# Patient Record
Sex: Female | Born: 1975 | Race: Black or African American | Hispanic: No | Marital: Single | Smoking: Current every day smoker
Health system: Southern US, Community
[De-identification: ages and names within clinical notes are randomized; demographics above are authoritative.]

## PROBLEM LIST (undated history)

## (undated) DIAGNOSIS — F419 Anxiety disorder, unspecified: Secondary | ICD-10-CM

## (undated) DIAGNOSIS — D649 Anemia, unspecified: Secondary | ICD-10-CM

## (undated) HISTORY — DX: Anxiety disorder, unspecified: F41.9

## (undated) HISTORY — PX: TUBAL LIGATION: SHX77

## (undated) HISTORY — DX: Anemia, unspecified: D64.9

---

## 2014-07-03 ENCOUNTER — Emergency Department: Payer: Self-pay | Admitting: Emergency Medicine

## 2017-11-28 ENCOUNTER — Emergency Department
Admission: EM | Admit: 2017-11-28 | Discharge: 2017-11-28 | Disposition: A | Discharge status: Home / Self Care | Payer: Self-pay | Attending: Emergency Medicine | Admitting: Emergency Medicine

## 2017-11-28 ENCOUNTER — Encounter: Payer: Self-pay | Admitting: Emergency Medicine

## 2017-11-28 DIAGNOSIS — G5602 Carpal tunnel syndrome, left upper limb: Secondary | ICD-10-CM | POA: Insufficient documentation

## 2017-11-28 MED ORDER — MELOXICAM 15 MG PO TABS: 15.0000 mg | ORAL_TABLET | Freq: Every day | ORAL | 0 refills | Status: DC

## 2017-11-28 NOTE — Discharge Instructions (Signed)
Wear the brace for the next several days with the exception of showering.  Follow up with the orthopedic doctor if not improving over a week or so.  Return to the ER for symptoms that change or worsen if unable to schedule an appointment.

## 2017-11-28 NOTE — ED Provider Notes (Signed)
[facility] Emergency Department Provider Note ____________________________________________  Time seen: Approximately 2:08 PM  I have reviewed the triage vital signs and the nursing notes.   HISTORY  Chief Complaint Tingling    HPI Lori Bradley is a 42 y.o. female who presents to the emergency department for treatment and evaluation of left arm tingling that started while she was folding laundry today.  She states that it starts in the elbow and radiates down to the hand.  When this occurs, she states that it causes her to have an increase in her anxiety level.  She has had no other symptoms.  Tingling is intermittent and lasts approximately 2 minutes with each episode.  She has had no weakness in the hand.  No alleviating measures have been attempted complaint prior to arrival.  History reviewed. No pertinent past medical history.  There are no active problems to display for this patient.   History reviewed. No pertinent surgical history.  Prior to Admission medications   Medication Sig Start Date End Date Taking? Authorizing Provider  meloxicam (MOBIC) 15 MG tablet Take 1 tablet (15 mg total) by mouth daily. 11/28/17   Chinita Pester, FNP    Allergies Patient has no allergy information on record.  No family history on file.  Social History Social History   Tobacco Use  . Smoking status: Not on file  Substance Use Topics  . Alcohol use: Not on file  . Drug use: Not on file    Review of Systems Constitutional: Negative for fever or recent illness. Cardiovascular: Negative for chest pain Respiratory: Negative for shortness of breath Musculoskeletal: Positive for left arm pain Skin: Negative for rash, lesion, or wound  Neurological: Positive for paresthesias of the left hand.  ____________________________________________   PHYSICAL EXAM:  VITAL SIGNS: ED Triage Vitals [11/28/17 1223]  Enc Vitals Group     BP (!) 142/95     Pulse  Rate 79     Resp 20     Temp 98.7 F (37.1 C)     Temp Source Oral     SpO2 99 %     Weight 199 lb (90.3 kg)     Height 5\' 5"  (1.651 m)     Head Circumference      Peak Flow      Pain Score 6     Pain Loc      Pain Edu?      Excl. in GC?     Constitutional: Alert and oriented. Well appearing and in no acute distress. Eyes: Conjunctivae are clear without discharge or drainage Head: Atraumatic Neck: Supple Respiratory: Respirations even and unlabored.  Breath sounds clear to auscultation. Musculoskeletal: Patient has full, active range of motion of the left upper extremity.  Phalen's test is positive of the left hand Neurologic: Paresthesias of the left lower arm and hand with Phalen's test Skin: Intact, dry, no lesions or wounds. Psychiatric: Affect and behavior are appropriate  ____________________________________________   LABS (all labs ordered are listed, but only abnormal results are displayed)  Labs Reviewed - No data to display ____________________________________________  RADIOLOGY  Not indicated ____________________________________________   PROCEDURES  Procedures  ____________________________________________   INITIAL IMPRESSION / ASSESSMENT AND PLAN / ED COURSE  Lori Bradley is a 42 y.o. female who presents to the emergency department for treatment and evaluation of sudden onset left lower arm tingling that started today while folding laundry.  Symptoms and exam are most consistent with carpal tunnel.  A cockup splint was applied by the ER tech.  Patient was neurovascularly intact post application.  She will be given a prescription for meloxicam.  She was encouraged to follow-up with orthopedics for symptoms that are not improving over the week.  She was instructed to return to the emergency department for symptoms of change or worsen if she is unable to schedule an appointment.  Medications - No data to display  Pertinent labs & imaging results  that were available during my care of the patient were reviewed by me and considered in my medical decision making (see chart for details).  _________________________________________   FINAL CLINICAL IMPRESSION(S) / ED DIAGNOSES  Final diagnoses:  Carpal tunnel syndrome on left    ED Discharge Orders        Ordered    meloxicam (MOBIC) 15 MG tablet  Daily     11/28/17 1508       If controlled substance prescribed during this visit, 12 month history viewed on the [facility] prior to issuing an initial prescription for Schedule II or III opiod.    Chinita Pesterriplett, January Bergthold B, FNP 11/28/17 1547    Governor RooksLord, Rebecca, MD 11/29/17 [phone]

## 2017-11-28 NOTE — ED Triage Notes (Signed)
Pt reports left arm tingling and when it does it raises her anxiety. Pt denies injuries. Pt denies other symptoms. Pt reports tingling is intermittent and from the elbow down.

## 2017-11-28 NOTE — ED Notes (Signed)
See triage note  Presents with some intermittent tingling to left arm this am    States each episode last approx 2 mins  Grips equal

## 2017-11-28 NOTE — ED Notes (Signed)
FN:pt with left arm tingling from elbow down to hand started this am. Denies any other sx. Pt ambulatory to stat desk with no difficulty noted.

## 2018-03-05 ENCOUNTER — Other Ambulatory Visit: Payer: Self-pay

## 2018-03-05 ENCOUNTER — Encounter: Payer: Self-pay | Admitting: Emergency Medicine

## 2018-03-05 ENCOUNTER — Emergency Department
Admission: EM | Admit: 2018-03-05 | Discharge: 2018-03-05 | Disposition: A | Discharge status: Home / Self Care | Payer: Self-pay | Attending: Emergency Medicine | Admitting: Emergency Medicine

## 2018-03-05 ENCOUNTER — Emergency Department: Payer: Self-pay

## 2018-03-05 DIAGNOSIS — R55 Syncope and collapse: Secondary | ICD-10-CM | POA: Insufficient documentation

## 2018-03-05 DIAGNOSIS — F17210 Nicotine dependence, cigarettes, uncomplicated: Secondary | ICD-10-CM | POA: Insufficient documentation

## 2018-03-05 DIAGNOSIS — Z79899 Other long term (current) drug therapy: Secondary | ICD-10-CM | POA: Insufficient documentation

## 2018-03-05 LAB — CBC
HEMATOCRIT: 37.2 % (ref 35.0–47.0)
HEMOGLOBIN: 12.3 g/dL (ref 12.0–16.0)
MCH: 30.1 pg (ref 26.0–34.0)
MCHC: 33.1 g/dL (ref 32.0–36.0)
MCV: 90.7 fL (ref 80.0–100.0)
Platelets: 232 10*3/uL (ref 150–440)
RBC: 4.1 MIL/uL (ref 3.80–5.20)
RDW: 16.6 % — AB (ref 11.5–14.5)
WBC: 6 10*3/uL (ref 3.6–11.0)

## 2018-03-05 LAB — BASIC METABOLIC PANEL
ANION GAP: 11 (ref 5–15)
BUN: 8 mg/dL (ref 6–20)
CHLORIDE: 104 mmol/L (ref 101–111)
CO2: 20 mmol/L — ABNORMAL LOW (ref 22–32)
Calcium: 8.4 mg/dL — ABNORMAL LOW (ref 8.9–10.3)
Creatinine, Ser: 0.66 mg/dL (ref 0.44–1.00)
GFR calc Af Amer: >60 mL/min (ref >60)
Glucose, Bld: 97 mg/dL (ref 65–99)
POTASSIUM: 3.7 mmol/L (ref 3.5–5.1)
SODIUM: 135 mmol/L (ref 135–145)

## 2018-03-05 LAB — TROPONIN I: Troponin I: <0.03 ng/mL (ref <0.03)

## 2018-03-05 NOTE — Discharge Instructions (Addendum)
Please seek medical attention for any high fevers, chest pain, shortness of breath, change in behavior, persistent vomiting, bloody stool or any other new or concerning symptoms.  

## 2018-03-05 NOTE — ED Triage Notes (Signed)
Pt to ED from home c/o feeling dizzy and "swimmy headed" that started about 1550 today.  Denies pain, SOB, visual changes, numbness or tingling, falling or LOC.  Denies hx of stroke, blood clots or blood thinners.  Patient is A&Ox4, speaking in complete and coherent sentences, chest rise even and unlabored, (+) sensation and grips bilaterally.

## 2018-03-05 NOTE — ED Provider Notes (Signed)
[facility] Emergency Department Provider Note   ____________________________________________   I have reviewed the triage vital signs and the nursing notes.   HISTORY  Chief Complaint Dizziness   History limited by: Not Limited   HPI Lori Bradley is a 42 y.o. female who presents to the emergency department today after an episode of near syncope.  The patient states that she was washing dishes at the time.  All of a sudden she felt a warm sensation come over her body.  She then says that her head felt swimmy headed.  She did not pass out but felt like she might.  After that sensation that she felt like her heart was racing although she denies any sensation of skipping beats.  She does think that it caused her anxiety to increase because she also started shaking.  The time my examination she is feeling better.  She denies any similar symptoms in the past.  States that she ate and drank her normal amount today.  Denies any recent illnesses.    History reviewed. No pertinent past medical history.  There are no active problems to display for this patient.   History reviewed. No pertinent surgical history.  Prior to Admission medications   Medication Sig Start Date End Date Taking? Authorizing Provider  meloxicam (MOBIC) 15 MG tablet Take 1 tablet (15 mg total) by mouth daily. 11/28/17   Chinita Pesterriplett, Cari B, FNP    Allergies Patient has no known allergies.  History reviewed. No pertinent family history.  Social History Social History   Tobacco Use  . Smoking status: Current Every Day Smoker    Packs/day: 0.50    Types: Cigarettes  . Smokeless tobacco: Never Used  Substance Use Topics  . Alcohol use: Yes    Alcohol/week: 14.4 oz    Types: 24 Cans of beer per week    Frequency: Never  . Drug use: Never    Review of Systems Constitutional: No fever/chills Eyes: No visual changes. ENT: No sore throat. Cardiovascular: Denies chest pain.  Positive for fast heart rate Respiratory: Denies shortness of breath. Gastrointestinal: No abdominal pain.  No nausea, no vomiting.  No diarrhea.   Genitourinary: Negative for dysuria. Musculoskeletal: Negative for back pain. Skin: Negative for rash. Neurological: Negative for headaches, focal weakness or numbness.  ____________________________________________   PHYSICAL EXAM:  VITAL SIGNS: ED Triage Vitals [03/05/18 1658]  Enc Vitals Group     BP (!) 148/92     Pulse Rate 95     Resp 14     Temp 98.6 F (37 C)     Temp Source Oral     SpO2 97 %     Weight 198 lb (89.8 kg)     Height 5\' 5"  (1.651 m)     Head Circumference      Peak Flow      Pain Score 0   Constitutional: Alert and oriented.  Eyes: Conjunctivae are normal.  ENT      Head: Normocephalic and atraumatic.      Nose: No congestion/rhinnorhea.      Mouth/Throat: Mucous membranes are moist.      Neck: No stridor. Hematological/Lymphatic/Immunilogical: No cervical lymphadenopathy. Cardiovascular: Normal rate, regular rhythm.  No murmurs, rubs, or gallops.  Respiratory: Normal respiratory effort without tachypnea nor retractions. Breath sounds are clear and equal bilaterally. No wheezes/rales/rhonchi. Gastrointestinal: Soft and non tender. No rebound. No guarding.  Genitourinary: Deferred Musculoskeletal: Normal range of motion in all extremities. No lower  extremity edema. Neurologic:  Normal speech and language. No gross focal neurologic deficits are appreciated.  Skin:  Skin is warm, dry and intact. No rash noted. Psychiatric: Mood and affect are normal. Speech and behavior are normal. Patient exhibits appropriate insight and judgment.  ____________________________________________    LABS (pertinent positives/negatives)  BMP na 135, k 3.7, co2 20, cr 0.66 CBC wnl except rdw 16.6 Trop <0.03  ____________________________________________   EKG  I, Phineas Semen, attending physician, personally  viewed and interpreted this EKG  EKG Time: 1708 Rate: 85 Rhythm: normal sinus rhythm with arrythmia Axis: normal Intervals: qtc 490 QRS: narrow ST changes: no st elevation Impression: abnormal ekg   ____________________________________________    RADIOLOGY  CXR No active disease  ____________________________________________   PROCEDURES  Procedures  ____________________________________________   INITIAL IMPRESSION / ASSESSMENT AND PLAN / ED COURSE  Pertinent labs & imaging results that were available during my care of the patient were reviewed by me and considered in my medical decision making (see chart for details).   Patient presented to the emergency department today after what sounds like a near syncopal episode.  Differential would be broad including anemia electrolyte abnormality arrhythmia ACS cranial process seizures amongst other etiologies.  Work-up here without concerning findings.  Patient has returned to baseline.  No concerning findings on physical exam.  This point unclear etiology of the patient's blood present.  We did discuss return precautions and importance of following up with primary care.  ____________________________________________   FINAL CLINICAL IMPRESSION(S) / ED DIAGNOSES  Final diagnoses:  Near syncope     Note: This dictation was prepared with Dragon dictation. Any transcriptional errors that result from this process are unintentional     Phineas Semen, MD 03/06/18 [phone]

## 2018-08-09 ENCOUNTER — Other Ambulatory Visit: Payer: Self-pay

## 2018-08-09 ENCOUNTER — Encounter: Payer: Self-pay | Admitting: Physician Assistant

## 2018-08-09 ENCOUNTER — Ambulatory Visit: Payer: BLUE CROSS/BLUE SHIELD | Admitting: Physician Assistant

## 2018-08-09 VITALS — BP 114/80 | HR 85 | Temp 98.4°F | Resp 16 | Ht 66.0 in | Wt 190.0 lb

## 2018-08-09 DIAGNOSIS — Z130 Encounter for screening for diseases of the blood and blood-forming organs and certain disorders involving the immune mechanism: Secondary | ICD-10-CM | POA: Diagnosis not present

## 2018-08-09 DIAGNOSIS — Z800 Family history of malignant neoplasm of digestive organs: Secondary | ICD-10-CM

## 2018-08-09 DIAGNOSIS — Z13220 Encounter for screening for lipoid disorders: Secondary | ICD-10-CM | POA: Diagnosis not present

## 2018-08-09 DIAGNOSIS — Z1329 Encounter for screening for other suspected endocrine disorder: Secondary | ICD-10-CM

## 2018-08-09 DIAGNOSIS — Z114 Encounter for screening for human immunodeficiency virus [HIV]: Secondary | ICD-10-CM | POA: Diagnosis not present

## 2018-08-09 DIAGNOSIS — Z131 Encounter for screening for diabetes mellitus: Secondary | ICD-10-CM | POA: Diagnosis not present

## 2018-08-09 DIAGNOSIS — Z720 Tobacco use: Secondary | ICD-10-CM

## 2018-08-09 NOTE — Progress Notes (Signed)
Patient: Lori Bradley, Female    DOB: Aug 22, 1976, 42 y.o.   MRN: [id number] Visit Date: 08/12/2018  Today's Provider: Trey Sailors, PA-C   Chief Complaint  Patient presents with  . New Patient (Initial Visit)  . Hypertension   Subjective:    Annual physical exam Lori Bradley is a 42 y.o. female who presents today to establish care. Patient reports she has not had a PCP in several years.  Living with son in [place], [place] aged 32. Other children are 32 year and 68 year old. 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 Smoking: 1/2 pack a day, smoking since age 68 a pack a day. Interested in quitting. Alcohol: 6 beers a night every night. PAP smear: 11 years ago Mother dx with colon cancer at age 47 and died recently.    -----------------------------------------------------------------   Review of Systems  Constitutional: Negative.   HENT: Positive for dental problem.   Eyes: Positive for discharge.  Respiratory: Negative.   Cardiovascular: Negative.   Gastrointestinal: Negative.   Endocrine: Negative.   Genitourinary: Negative.   Musculoskeletal: Positive for arthralgias and myalgias.  Skin: Positive for rash.  Allergic/Immunologic: Positive for environmental allergies.  Neurological: Negative.   Hematological: Bruises/bleeds easily.  Psychiatric/Behavioral: Negative.     Social History      She  reports that she has been smoking cigarettes. She has been smoking about 0.50 packs per day. She has never used smokeless tobacco. She reports that she drinks about 24.0 standard drinks of alcohol per week. She reports that she does not use drugs.       Social History   Socioeconomic History  . Marital status: Single    Spouse name: Not on file  . Number of children: Not on file  . Years of education: Not on file  . Highest education level: Not on file  Occupational History  . Not on file  Social Needs  . Financial resource strain: Not on file  . Food  insecurity:    Worry: Not on file    Inability: Not on file  . Transportation needs:    Medical: Not on file    Non-medical: Not on file  Tobacco Use  . Smoking status: Current Every Day Smoker    Packs/day: 0.50    Types: Cigarettes  . Smokeless tobacco: Never Used  Substance and Sexual Activity  . Alcohol use: Yes    Alcohol/week: 24.0 standard drinks    Types: 24 Cans of beer per week    Frequency: Never  . Drug use: Never  . Sexual activity: Not on file  Lifestyle  . Physical activity:    Days per week: Not on file    Minutes per session: Not on file  . Stress: Not on file  Relationships  . Social connections:    Talks on phone: Not on file    Gets together: Not on file    Attends religious service: Not on file    Active member of club or organization: Not on file    Attends meetings of clubs or organizations: Not on file    Relationship status: Not on file  Other Topics Concern  . Not on file  Social History Narrative  . Not on file    Past Medical History:  Diagnosis Date  . Anemia   . Anxiety      There are no active problems to display for this patient.   Past Surgical History:  Procedure Laterality Date  . TUBAL LIGATION      Family History        Family Status  Relation Name Status  . Mother  Alive        Her family history includes Colon cancer in her mother.      Allergies  Allergen Reactions  . Latex      Current Outpatient Medications:  .  Cyanocobalamin (VITAMIN B 12 PO), Take 1,000 Units by mouth., Disp: , Rfl:  .  ibuprofen (ADVIL,MOTRIN) 200 MG tablet, Take 200 mg by mouth every 6 (six) hours as needed., Disp: , Rfl:  .  vitamin C (ASCORBIC ACID) 500 MG tablet, Take 500 mg by mouth daily., Disp: , Rfl:    Patient Care Team: Maryella Shivers as PCP - General (Physician Assistant)      Objective:   Vitals: BP 114/80 (BP Location: Left Arm, Patient Position: Sitting, Cuff Size: Normal)   Pulse 85   Temp 98.4 F  (36.9 C) (Oral)   Resp 16   Ht 5\' 6"  (1.676 m)   Wt 190 lb (86.2 kg)   LMP 07/19/2018 (Exact Date)   SpO2 98%   BMI 30.67 kg/m    Vitals:   08/09/18 0911  BP: 114/80  Pulse: 85  Resp: 16  Temp: 98.4 F (36.9 C)  TempSrc: Oral  SpO2: 98%  Weight: 190 lb (86.2 kg)  Height: 5\' 6"  (1.676 m)     Physical Exam  Constitutional: She is oriented to person, place, and time. She appears well-developed and well-nourished.  Cardiovascular: Normal rate and regular rhythm.  Pulmonary/Chest: Effort normal and breath sounds normal.  Abdominal: Soft. Bowel sounds are normal.  Neurological: She is alert and oriented to person, place, and time.  Skin: Skin is warm and dry.  Psychiatric: She has a normal mood and affect. Her behavior is normal.     Depression Screen No flowsheet data found.    Assessment & Plan:     Routine Health Maintenance and Physical Exam  Exercise Activities and Dietary recommendations Goals   None      There is no immunization history on file for this patient.  Health Maintenance  Topic Date Due  . TETANUS/TDAP  02/22/1997  . PAP SMEAR  02/23/1999  . INFLUENZA VACCINE  05/05/2018  . HIV Screening  Completed     Discussed health benefits of physical activity, and encouraged her to engage in regular exercise appropriate for her age and condition.    1. Family history of colon cancer in mother  - Ambulatory referral to Gastroenterology  2. Encounter for screening for HIV  - HIV antibody (with reflex)  3. Screening for deficiency anemia  - CBC with Differential  4. Diabetes mellitus screening  - Comprehensive Metabolic Panel (CMET)  5. Lipid screening  - Lipid Profile  6. Thyroid disorder screening  - TSH  7. Tobacco abuse  Will discuss at CPE.   Return in about 1 month (around 09/08/2018) for CPE 40 min .  The entirety of the information documented in the History of Present Illness, Review of Systems and Physical Exam were  personally obtained by me. Portions of this information were initially documented by Rondel Baton, CMA and reviewed by me for thoroughness and accuracy.   --------------------------------------------------------------------    Trey Sailors, PA-C  [facility]

## 2018-08-09 NOTE — Patient Instructions (Signed)

## 2018-08-10 LAB — TSH: TSH: 0.711 u[IU]/mL (ref 0.450–4.500)

## 2018-08-10 LAB — CBC WITH DIFFERENTIAL/PLATELET
Basophils Absolute: 0 10*3/uL (ref 0.0–0.2)
Basos: 1 %
EOS (ABSOLUTE): 0.1 10*3/uL (ref 0.0–0.4)
Eos: 3 %
Hematocrit: 37.7 % (ref 34.0–46.6)
Hemoglobin: 12.4 g/dL (ref 11.1–15.9)
Immature Grans (Abs): 0 10*3/uL (ref 0.0–0.1)
Immature Granulocytes: 0 %
Lymphocytes Absolute: 1.9 10*3/uL (ref 0.7–3.1)
Lymphs: 40 %
MCH: 30.5 pg (ref 26.6–33.0)
MCHC: 32.9 g/dL (ref 31.5–35.7)
MCV: 93 fL (ref 79–97)
Monocytes Absolute: 0.5 10*3/uL (ref 0.1–0.9)
Monocytes: 10 %
Neutrophils Absolute: 2.2 10*3/uL (ref 1.4–7.0)
Neutrophils: 46 %
Platelets: 235 10*3/uL (ref 150–450)
RBC: 4.07 x10E6/uL (ref 3.77–5.28)
RDW: 14.5 % (ref 12.3–15.4)
WBC: 4.8 10*3/uL (ref 3.4–10.8)

## 2018-08-10 LAB — LIPID PANEL
Chol/HDL Ratio: 2.9 ratio (ref 0.0–4.4)
Cholesterol, Total: 159 mg/dL (ref 100–199)
HDL: 55 mg/dL (ref >39)
LDL Calculated: 91 mg/dL (ref 0–99)
Triglycerides: 63 mg/dL (ref 0–149)
VLDL Cholesterol Cal: 13 mg/dL (ref 5–40)

## 2018-08-10 LAB — COMPREHENSIVE METABOLIC PANEL
ALT: 20 IU/L (ref 0–32)
AST: 20 IU/L (ref 0–40)
Albumin/Globulin Ratio: 2 (ref 1.2–2.2)
Albumin: 4.5 g/dL (ref 3.5–5.5)
Alkaline Phosphatase: 65 IU/L (ref 39–117)
BUN/Creatinine Ratio: 7 — ABNORMAL LOW (ref 9–23)
BUN: 5 mg/dL — ABNORMAL LOW (ref 6–24)
Bilirubin Total: 0.2 mg/dL (ref 0.0–1.2)
CO2: 22 mmol/L (ref 20–29)
Calcium: 8.8 mg/dL (ref 8.7–10.2)
Chloride: 107 mmol/L — ABNORMAL HIGH (ref 96–106)
Creatinine, Ser: 0.73 mg/dL (ref 0.57–1.00)
GFR calc Af Amer: 119 mL/min/{1.73_m2} (ref >59)
GFR calc non Af Amer: 103 mL/min/{1.73_m2} (ref >59)
Globulin, Total: 2.2 g/dL (ref 1.5–4.5)
Glucose: 74 mg/dL (ref 65–99)
Potassium: 4.1 mmol/L (ref 3.5–5.2)
Sodium: 145 mmol/L — ABNORMAL HIGH (ref 134–144)
Total Protein: 6.7 g/dL (ref 6.0–8.5)

## 2018-08-10 LAB — HIV ANTIBODY (ROUTINE TESTING W REFLEX): HIV Screen 4th Generation wRfx: NONREACTIVE

## 2018-08-12 DIAGNOSIS — Z800 Family history of malignant neoplasm of digestive organs: Secondary | ICD-10-CM | POA: Insufficient documentation

## 2018-08-12 DIAGNOSIS — Z720 Tobacco use: Secondary | ICD-10-CM | POA: Insufficient documentation

## 2018-08-23 ENCOUNTER — Telehealth: Payer: Self-pay

## 2018-08-23 NOTE — Telephone Encounter (Signed)
Patient contacted office to cancel her colonoscopy.  Her insurance would not cover her to have it.  No cancellation fee will be applied.  Sent a fax to Trish in Endoscopy for her to cancel procedure .  Thanks [employer]

## 2018-08-25 ENCOUNTER — Ambulatory Visit
Admission: RE | Admit: 2018-08-25 | Payer: BLUE CROSS/BLUE SHIELD | Source: Ambulatory Visit | Admitting: Gastroenterology

## 2018-08-25 ENCOUNTER — Encounter: Admission: RE | Payer: Self-pay | Source: Ambulatory Visit

## 2018-08-25 SURGERY — COLONOSCOPY WITH PROPOFOL
Anesthesia: General

## 2018-09-08 ENCOUNTER — Encounter: Payer: Self-pay | Admitting: Physician Assistant

## 2018-09-08 ENCOUNTER — Ambulatory Visit (INDEPENDENT_AMBULATORY_CARE_PROVIDER_SITE_OTHER): Payer: BLUE CROSS/BLUE SHIELD | Admitting: Physician Assistant

## 2018-09-08 ENCOUNTER — Other Ambulatory Visit (HOSPITAL_COMMUNITY)
Admission: RE | Admit: 2018-09-08 | Discharge: 2018-09-08 | Disposition: A | Discharge status: Home / Self Care | Payer: BLUE CROSS/BLUE SHIELD | Source: Ambulatory Visit | Attending: Physician Assistant | Admitting: Physician Assistant

## 2018-09-08 VITALS — BP 128/86 | HR 81 | Temp 98.3°F | Wt 194.2 lb

## 2018-09-08 DIAGNOSIS — Z124 Encounter for screening for malignant neoplasm of cervix: Secondary | ICD-10-CM

## 2018-09-08 DIAGNOSIS — Z800 Family history of malignant neoplasm of digestive organs: Secondary | ICD-10-CM | POA: Diagnosis not present

## 2018-09-08 DIAGNOSIS — Z720 Tobacco use: Secondary | ICD-10-CM

## 2018-09-08 DIAGNOSIS — Z0000 Encounter for general adult medical examination without abnormal findings: Secondary | ICD-10-CM

## 2018-09-08 DIAGNOSIS — Z23 Encounter for immunization: Secondary | ICD-10-CM

## 2018-09-08 NOTE — Progress Notes (Signed)
Patient: Lori Bradley, Female    DOB: 1976-01-30, 42 y.o.   MRN: [id number] Visit Date: 09/13/2018  Today's Provider: Trey SailorsAdriana M Pollak, PA-C   Chief Complaint  Patient presents with  . Annual Exam   Subjective:    Annual physical exam Lori Bradley is a 42 y.o. female who presents today for health maintenance and complete physical. She feels well. She reports exercising none. She reports she is sleeping well. 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 Continues to smoke.   PAP: Due, last PAP 11 years ago Mammogram: Not due, no family history Flu: declined Tetanus: Due Colon cancer Screening: She has a family history of colon cancer in her mother, who died of the disease at age 40. She was scheduled for a colonoscopy but got nervous and cancelled the procedure.  -----------------------------------------------------------------   Review of Systems  Constitutional: Negative.   HENT: Negative.   Eyes: Positive for discharge.  Respiratory: Negative.   Cardiovascular: Negative.   Gastrointestinal: Negative.   Endocrine: Positive for cold intolerance.  Genitourinary: Negative.   Musculoskeletal: Positive for arthralgias and myalgias.  Skin: Negative.   Allergic/Immunologic: Negative.   Neurological: Negative.   Hematological: Negative.   Psychiatric/Behavioral: Negative.     Social History She  reports that she has been smoking cigarettes. She has been smoking about 0.50 packs per day. She has never used smokeless tobacco. She reports that she drinks about 24.0 standard drinks of alcohol per week. She reports that she does not use drugs. Social History   Socioeconomic History  . Marital status: Single    Spouse name: Not on file  . Number of children: Not on file  . Years of education: Not on file  . Highest education level: Not on file  Occupational History  . Not on file  Social Needs  . Financial resource strain: Not on file  . Food insecurity:   Worry: Not on file    Inability: Not on file  . Transportation needs:    Medical: Not on file    Non-medical: Not on file  Tobacco Use  . Smoking status: Current Every Day Smoker    Packs/day: 0.50    Types: Cigarettes  . Smokeless tobacco: Never Used  Substance and Sexual Activity  . Alcohol use: Yes    Alcohol/week: 24.0 standard drinks    Types: 24 Cans of beer per week    Frequency: Never  . Drug use: Never  . Sexual activity: Not on file  Lifestyle  . Physical activity:    Days per week: Not on file    Minutes per session: Not on file  . Stress: Not on file  Relationships  . Social connections:    Talks on phone: Not on file    Gets together: Not on file    Attends religious service: Not on file    Active member of club or organization: Not on file    Attends meetings of clubs or organizations: Not on file    Relationship status: Not on file  Other Topics Concern  . Not on file  Social History Narrative  . Not on file    Patient Active Problem List   Diagnosis Date Noted  . Family history of colon cancer in mother 08/12/2018  . Tobacco abuse 08/12/2018    Past Surgical History:  Procedure Laterality Date  . TUBAL LIGATION      Family History  Family Status  Relation Name  Status  . Mother  Alive   Her family history includes Colon cancer in her mother.     Allergies  Allergen Reactions  . Latex     Previous Medications   CYANOCOBALAMIN (VITAMIN B 12 PO)    Take 1,000 Units by mouth.   IBUPROFEN (ADVIL,MOTRIN) 200 MG TABLET    Take 200 mg by mouth every 6 (six) hours as needed.   VITAMIN C (ASCORBIC ACID) 500 MG TABLET    Take 500 mg by mouth daily.    Patient Care Team: Maryella Shivers as PCP - General (Physician Assistant)      Objective:   Vitals: BP 128/86 (BP Location: Right Arm, Patient Position: Sitting, Cuff Size: Normal)   Pulse 81   Temp 98.3 F (36.8 C) (Oral)   Wt 194 lb 3.2 oz (88.1 kg)   LMP 08/13/2018   SpO2 99%    BMI 31.34 kg/m    Physical Exam  Constitutional: She is oriented to person, place, and time. She appears well-developed and well-nourished.  Cardiovascular: Normal rate and regular rhythm.  Pulmonary/Chest: Effort normal and breath sounds normal. Right breast exhibits no inverted nipple, no mass, no nipple discharge, no skin change and no tenderness. Left breast exhibits no inverted nipple, no mass, no nipple discharge, no skin change and no tenderness. No breast swelling, tenderness, discharge or bleeding. Breasts are symmetrical.  Abdominal: Soft. Bowel sounds are normal.  Genitourinary: Vagina normal. No labial fusion. There is no rash, tenderness, lesion or injury on the right labia. There is no rash, tenderness, lesion or injury on the left labia. Cervix exhibits no motion tenderness, no discharge and no friability. Right adnexum displays no mass, no tenderness and no fullness. Left adnexum displays no mass, no tenderness and no fullness.  Neurological: She is alert and oriented to person, place, and time.  Skin: Skin is warm and dry.  Psychiatric: She has a normal mood and affect. Her behavior is normal.     Depression Screen PHQ 2/9 Scores 09/08/2018  PHQ - 2 Score 0      Assessment & Plan:     Routine Health Maintenance and Physical Exam  Exercise Activities and Dietary recommendations Goals   None     Immunization History  Administered Date(s) Administered  . Tdap 09/08/2018    Health Maintenance  Topic Date Due  . INFLUENZA VACCINE  09/22/2019 (Originally 05/05/2018)  . PAP SMEAR  09/08/2021  . TETANUS/TDAP  09/08/2028  . HIV Screening  Completed     Discussed health benefits of physical activity, and encouraged her to engage in regular exercise appropriate for her age and condition.    1. Annual physical exam   2. Screening for cervical cancer  - Cytology - PAP  3. Need for Tdap vaccination  - Tdap vaccine greater than or equal to 7yo IM  4. Family  history of colon cancer in mother  We decided it would be more comfortable for her to go over the procedure in depth with the GI doctor. Due to her family history, she really does need a colonoscopy and does not qualify for Cologuard and have explained this to her. She is agreeable, will place referral for OV.   - Ambulatory referral to Gastroenterology  5. Tobacco abuse Counseled on cessation, she does not desire cessation aids.  The entirety of the information documented in the History of Present Illness, Review of Systems and Physical Exam were personally obtained by me. Portions of this  information were initially documented by Dara Lords, CMA and reviewed by me for thoroughness and accuracy.   Return in about 1 year (around 09/09/2019) for CPE.      -------------------------------------------------------------------- Osvaldo Angst, PA-C

## 2018-09-12 ENCOUNTER — Other Ambulatory Visit: Payer: Self-pay | Admitting: Physician Assistant

## 2018-09-12 ENCOUNTER — Telehealth: Payer: Self-pay

## 2018-09-12 DIAGNOSIS — Z800 Family history of malignant neoplasm of digestive organs: Secondary | ICD-10-CM

## 2018-09-12 DIAGNOSIS — Z1211 Encounter for screening for malignant neoplasm of colon: Secondary | ICD-10-CM

## 2018-09-12 NOTE — Telephone Encounter (Signed)
[facility] had called the office stating that we had sent them documentation for two referrals for patient. The first was sen on 08/12/18 for colon cancer screening and nurse states there was another one was sent on 09/08/18 for cervical cancer screen. Patient is requesting a call back from nurse to clarify along with proper diagnose code for screening. KW

## 2018-09-12 NOTE — Telephone Encounter (Signed)
Sorry I must have associated it wrong. She will need it for colon cancer screening and family history of colon cancer. Her mother died of colon cancer and she is very nervous about the procedure and we agreed it would be helpful to see the GI doctor before doing the procedure. I will place a new referral.

## 2018-09-12 NOTE — Telephone Encounter (Signed)
Please Review

## 2018-09-12 NOTE — Telephone Encounter (Signed)
Spoke with Dennie BiblePat from GI and explained as directed below.

## 2018-09-13 ENCOUNTER — Telehealth: Payer: Self-pay

## 2018-09-13 LAB — CYTOLOGY - PAP
Adequacy: ABSENT
Diagnosis: NEGATIVE
HPV: NOT DETECTED

## 2018-09-13 NOTE — Telephone Encounter (Signed)
Patient advised as directed below.  Thanks,  -Inocente Krach 

## 2018-09-13 NOTE — Telephone Encounter (Signed)
-----   Message from Trey SailorsAdriana M Pollak, [place]PA-C sent at 09/13/2018  3:49 PM EST ----- PAP is normal. HPV is negative. Repeat 5 years.

## 2018-10-13 ENCOUNTER — Encounter: Payer: Self-pay | Admitting: Gastroenterology

## 2018-10-13 ENCOUNTER — Ambulatory Visit (INDEPENDENT_AMBULATORY_CARE_PROVIDER_SITE_OTHER): Payer: BLUE CROSS/BLUE SHIELD | Admitting: Gastroenterology

## 2018-10-13 VITALS — BP 120/78 | HR 83 | Ht 66.0 in | Wt 198.4 lb

## 2018-10-13 DIAGNOSIS — Z7289 Other problems related to lifestyle: Secondary | ICD-10-CM

## 2018-10-13 DIAGNOSIS — F1090 Alcohol use, unspecified, uncomplicated: Secondary | ICD-10-CM

## 2018-10-13 DIAGNOSIS — Z800 Family history of malignant neoplasm of digestive organs: Secondary | ICD-10-CM

## 2018-10-13 DIAGNOSIS — Z789 Other specified health status: Secondary | ICD-10-CM

## 2018-10-13 NOTE — Patient Instructions (Signed)
Alcohol Use Education Information about Your Drinking Your score on the Alcohol Use Disorders Identification Test was: AUDIT C:    TOTAL AUDIT SCORE:   .  This score places you in the category of:  Score 0 = Abstainers Score 8-19 = Unhealthy/High Risk Drinkers  Score 1-7 = Low Risk Drinkers Score 20+ = Probable Alcohol Dependence   High Scores (20+) on the Alcohol Use Identification Test Consider becoming involved in a structured program.  You should stop drinking if: You have tried to cut down before but have not been successful, or  You suffer from morning shakes during a heavy drinking period, or You have high blood pressure, or You are pregnant, or You have liver disease, or You are taking medicines that react with alcohol, or Your alcohol use is affecting your social relationships, or You have legal consequences like DUIs, or You call in sick to work, or You cannot take care of our children, or Someone close to you says you drink too much    How Much Alcohol is a Drink: Beer: 12 oz. = 1 drink 16 oz. = 1.3 drinks 22 oz. = 2 drinks 40 oz. = 3.3 drinks  Wine: 5 oz. = 1 drink 740 mL (25 oz.) bottle = 5 drinks Malt Liquor: 12 oz. = 1.5 drinks 16 oz. = 2 drinks 22 oz. = 2.5 drinks 40 oz. = 4.5 drinks  80-Proof Spirits - Hard Liquor: 1 shot = 1 drink 1 mixed drink = number of shots Can equal 1-3 drinks   What is Low-risk Drinking? Have no more than 2 drinks of alcohol per day Drink no more than 5 days per week Do not drink alcohol drink alcohol when: You drive or operate machinery You are pregnant or breast feeding You are taking medications that interact with alcohol You have medical conditions made worse with alcohol You can stop or control your drinking      Identify Your Triggers for Drinking Parties Particular People Feeling lonely Feeling tense Family problems Feeling sad Feeling happy Feeling bored After work Problems  sleeping Criticism Feelings of failure After being paid When others are drinking In bars When out for dinner After arguments Weekends Feeling restless Being in pain   Effects of High-Risk Drinking To the Brain: Aggressive, irrational behavior Arguments, violence Depression, nervousness Alcohol dependence, memory loss To the Nervous System: Trembling hands, tingling fingers Numbness, painful nerves Impaired sensation leading to falls Numb tingling toes To Your Lifestyle: Social, legal, medical problems Domestic trouble/relationship loss Job loss & financial problems Shortened life span Accidents and death from drunk driving   To the Face: Premature aging, drinker's nose Cancer of the throat & mouth To the Body: Frequent cold Reduced resistance to infection Increased risk of pneumonia Weakness of heart muscle Heart failure, anemia Impaired blood clotting Breast cancer Vitamin deficiency, bleeding Severe Inflammation of the stomach Vomiting, diarrhea, malnutrition Ulcer, inflammation of the pancreas Impaired sexual performance Birth defects, including deformities, retardation, and low birthweight   Ways to Cope Without Drinking Go home if you tend to drink after work Find another activity Switch to nonalcoholic beverages Change friends Join a club Volunteer Visit relatives Plan/take a trip Go for a walk Take up a hobby Listen to music Talk to a friend Reading What would you do if you had no worries about failing?         Good Reasons for Drinking Less I will live longer - probably 8-10 years. I will sleep better. I   will be happier. I will save a lot of money My relationships will improve. I will stay younger for longer. I will achieve more in my life There will be a greater chance that I will survive to a healthy old age with no premature damage to my brain.  I will be better at my job. I will be less likely to feel depressed and commit  suicide (6 times less likely). I will be less likely to die of heart disease or cancer. Other people will respect me I will be less likely to get into trouble with the police. The possibility that I will die of liver disease will be dramatically reduced (12 times less likely). It will be less likely that I will die in a car accident (3 times less likely).   Strategies for Cutting Down Keep Track.  Find a way to keep track of how much you drink.  If you make a note of each drink before you drink it, this will help slow you down. Count and Measure.  Know the standard drink sizes.  Ask the bartender or server about the amount of alcohol in a mixed drink. Set Goals.  Decide how many days a week you will drink and how many drinks each day. Pace and Space.  When you do drink, pace yourself.  Have no more than one drink with alcohol per hour.  Alternate "drink spacers" non-alcoholic drinks such as water, soda, or juice with drinks containing alcohol. Include Food.  Don't drink on an empty stomach.  Have some food so the alcohol will be absorbed more slowly into your system.  Avoid Triggers.  Avoid people, places, or activities that have led to drinking in the past.  Certain times of day or feelings may also be triggers.  Make a plan so you will know what you can do instead of drinking. Plan to Handle Urges.  When an urge hits, consider these options:  Remind yourself of your reasons for changing.  Or talk it through with someone you trust. Or get involved with a healthy, distracting activity.  Or, "urge surf" - instead of fighting the feeling, accept it and ride it out, knowing it will soon crest like a wave and pass. Know Your "No".  Have a polite, convincing "no thanks" for those times when you may be offered a drink and don't want one.  The faster you can say no to these offers, the less likely you are to give in.  If you hesitate, it allows you time to think of excuses to go along.    

## 2018-10-13 NOTE — Progress Notes (Signed)
Lori Bradley [address]  [address]  [place], [place] [address]  Main: [phone]  Fax: [phone]   Gastroenterology Consultation  Referring Provider:     Maryella Shivers Primary Care Physician:  Maryella Shivers Reason for Consultation:     Family history of colon cancer        HPI:   Chief complaint: Family history of colon cancer  Lori Bradley is a 43 y.o. y/o female referred for consultation & management  by Dr. Jodi Marble, Lavella Hammock, PA-C.  Patient reports family history of colon cancer in mother diagnosed at age 3 or 22, and died within 5 months of diagnosis. Denies any previous colonoscopies.  The patient denies abdominal or flank pain, anorexia, nausea or vomiting, dysphagia, change in bowel habits or black or bloody stools or weight loss.  Reports drinking 3, 12 ounce beer daily for over 10 years.  Denies any previous history of liver disease.   Past Medical History:  Diagnosis Date  . Anemia   . Anxiety     Past Surgical History:  Procedure Laterality Date  . TUBAL LIGATION      Prior to Admission medications   Medication Sig Start Date End Date Taking? Authorizing Provider  Cyanocobalamin (VITAMIN B 12 PO) Take 1,000 Units by mouth.    [provider]  ibuprofen (ADVIL,MOTRIN) 200 MG tablet Take 200 mg by mouth every 6 (six) hours as needed.    [provider]  vitamin C (ASCORBIC ACID) 500 MG tablet Take 500 mg by mouth daily.    [provider]    Family History  Problem Relation Age of Onset  . Colon cancer Mother      Social History   Tobacco Use  . Smoking status: Current Every Day Smoker    Packs/day: 0.50    Types: Cigarettes  . Smokeless tobacco: Never Used  Substance Use Topics  . Alcohol use: Yes    Alcohol/week: 24.0 standard drinks    Types: 24 Cans of beer per week    Frequency: Never  . Drug use: Never    Allergies as of 10/13/2018 - Review Complete 09/08/2018    Allergen Reaction Noted  . Latex  08/09/2018    Review of Systems:    All systems reviewed and negative except where noted in HPI.   Physical Exam:   Vitals:   10/13/18 1344  BP: 120/78  Pulse: 83  Weight: 198 lb 6.4 oz (90 kg)  Height: 5\' 6"  (1.676 m)    No LMP recorded. Psych:  Alert and cooperative. Normal mood and affect. General:   Alert,  Well-developed, well-nourished, pleasant and cooperative in NAD Head:  Normocephalic and atraumatic. Eyes:  Sclera clear, no icterus.   Conjunctiva pink. Ears:  Normal auditory acuity. Nose:  No deformity, discharge, or lesions. Mouth:  No deformity or lesions,oropharynx pink & moist. Neck:  Supple; no masses or thyromegaly. Abdomen:  Normal bowel sounds.  No bruits.  Soft, non-tender and non-distended without masses, hepatosplenomegaly or hernias noted.  No guarding or rebound tenderness.    Msk:  Symmetrical without gross deformities. Good, equal movement & strength bilaterally. Pulses:  Normal pulses noted. Extremities:  No clubbing or edema.  No cyanosis. Neurologic:  Alert and oriented x3;  grossly normal neurologically. Skin:  Intact without significant lesions or rashes. No jaundice. Lymph Nodes:  No significant cervical adenopathy. Psych:  Alert and cooperative. Normal mood and affect.   Labs: CBC  Component Value Date/Time   WBC 4.8 08/09/2018 0936   WBC 6.0 03/05/2018 1714   RBC 4.07 08/09/2018 0936   RBC 4.10 03/05/2018 1714   HGB 12.4 08/09/2018 0936   HCT 37.7 08/09/2018 0936   PLT 235 08/09/2018 0936   MCV 93 08/09/2018 0936   MCH 30.5 08/09/2018 0936   MCH 30.1 03/05/2018 1714   MCHC 32.9 08/09/2018 0936   MCHC 33.1 03/05/2018 1714   RDW 14.5 08/09/2018 0936   LYMPHSABS 1.9 08/09/2018 0936   EOSABS 0.1 08/09/2018 0936   BASOSABS 0.0 08/09/2018 0936   CMP     Component Value Date/Time   NA 145 (H) 08/09/2018 0936   K 4.1 08/09/2018 0936   CL 107 (H) 08/09/2018 0936   CO2 22 08/09/2018 0936    GLUCOSE 74 08/09/2018 0936   GLUCOSE 97 03/05/2018 1714   BUN 5 (L) 08/09/2018 0936   CREATININE 0.73 08/09/2018 0936   CALCIUM 8.8 08/09/2018 0936   PROT 6.7 08/09/2018 0936   ALBUMIN 4.5 08/09/2018 0936   AST 20 08/09/2018 0936   ALT 20 08/09/2018 0936   ALKPHOS 65 08/09/2018 0936   BILITOT 0.2 08/09/2018 0936   GFRNONAA 103 08/09/2018 0936   GFRAA 119 08/09/2018 0936    Imaging Studies: No results found.  Assessment and Plan:   Lori Bradley is a 43 y.o. y/o female has been referred for family history of colon cancer in mother diagnosed in her 55s  Due to family history of colon cancer we discussed high risk colon cancer screening I have discussed alternative options, risks & benefits,  which include, but are not limited to, bleeding, infection, perforation,respiratory complication & drug reaction.  The patient agrees with this plan & written consent will be obtained.    Patient was also informed she will need a colonoscopy every 5 years even if she does not have any colon polyps due to her family history of colon cancer.  Her liver Enzymes are normal, platelets are normal and she does not have any clinical evidence of cirrhosis. I extensively discussed her alcohol use with her.  I discussed the risks of developing cirrhosis with daily alcohol use and have strongly encouraged her to cut down to no more than 2-3 drinks in a week, no more than 2 drinks at one time. She verbalized understanding  Dr Lori Bradley  Speech recognition software was used to dictate the above note.

## 2018-10-14 ENCOUNTER — Other Ambulatory Visit: Payer: Self-pay

## 2018-10-14 DIAGNOSIS — Z800 Family history of malignant neoplasm of digestive organs: Secondary | ICD-10-CM

## 2018-10-20 ENCOUNTER — Ambulatory Visit: Payer: BLUE CROSS/BLUE SHIELD | Admitting: Gastroenterology

## 2018-11-08 ENCOUNTER — Telehealth: Payer: Self-pay | Admitting: Gastroenterology

## 2018-11-08 NOTE — Telephone Encounter (Signed)
Pt is calling to cancel her procedure 11/11/18 due to being under the weather and not getting her prep kid she will call back to r/s

## 2018-11-10 NOTE — Telephone Encounter (Signed)
Colonoscopy scheduled for 11/11/18 per pt request. Unable to reach pt at this time to reschedule. Will send recall letter. Trish at [facility] notified. No charge for cancellation, pt sick and called on 11/08/18.

## 2018-11-11 ENCOUNTER — Ambulatory Visit
Admission: RE | Admit: 2018-11-11 | Payer: BLUE CROSS/BLUE SHIELD | Source: Ambulatory Visit | Admitting: Gastroenterology

## 2018-11-11 ENCOUNTER — Encounter: Admission: RE | Payer: Self-pay | Source: Ambulatory Visit

## 2018-11-11 SURGERY — COLONOSCOPY WITH PROPOFOL
Anesthesia: General

## 2019-03-01 IMAGING — CR DG CHEST 2V
1 series · 2 of 2 positions shown · non-contrast
Comparison: None.

CLINICAL DATA: Dizziness

EXAM:
CHEST - 2 VIEW

[Series 1: dg chest 2 view · 0.14mm/px · 2 of 2 slices shown]
[im 1/2]
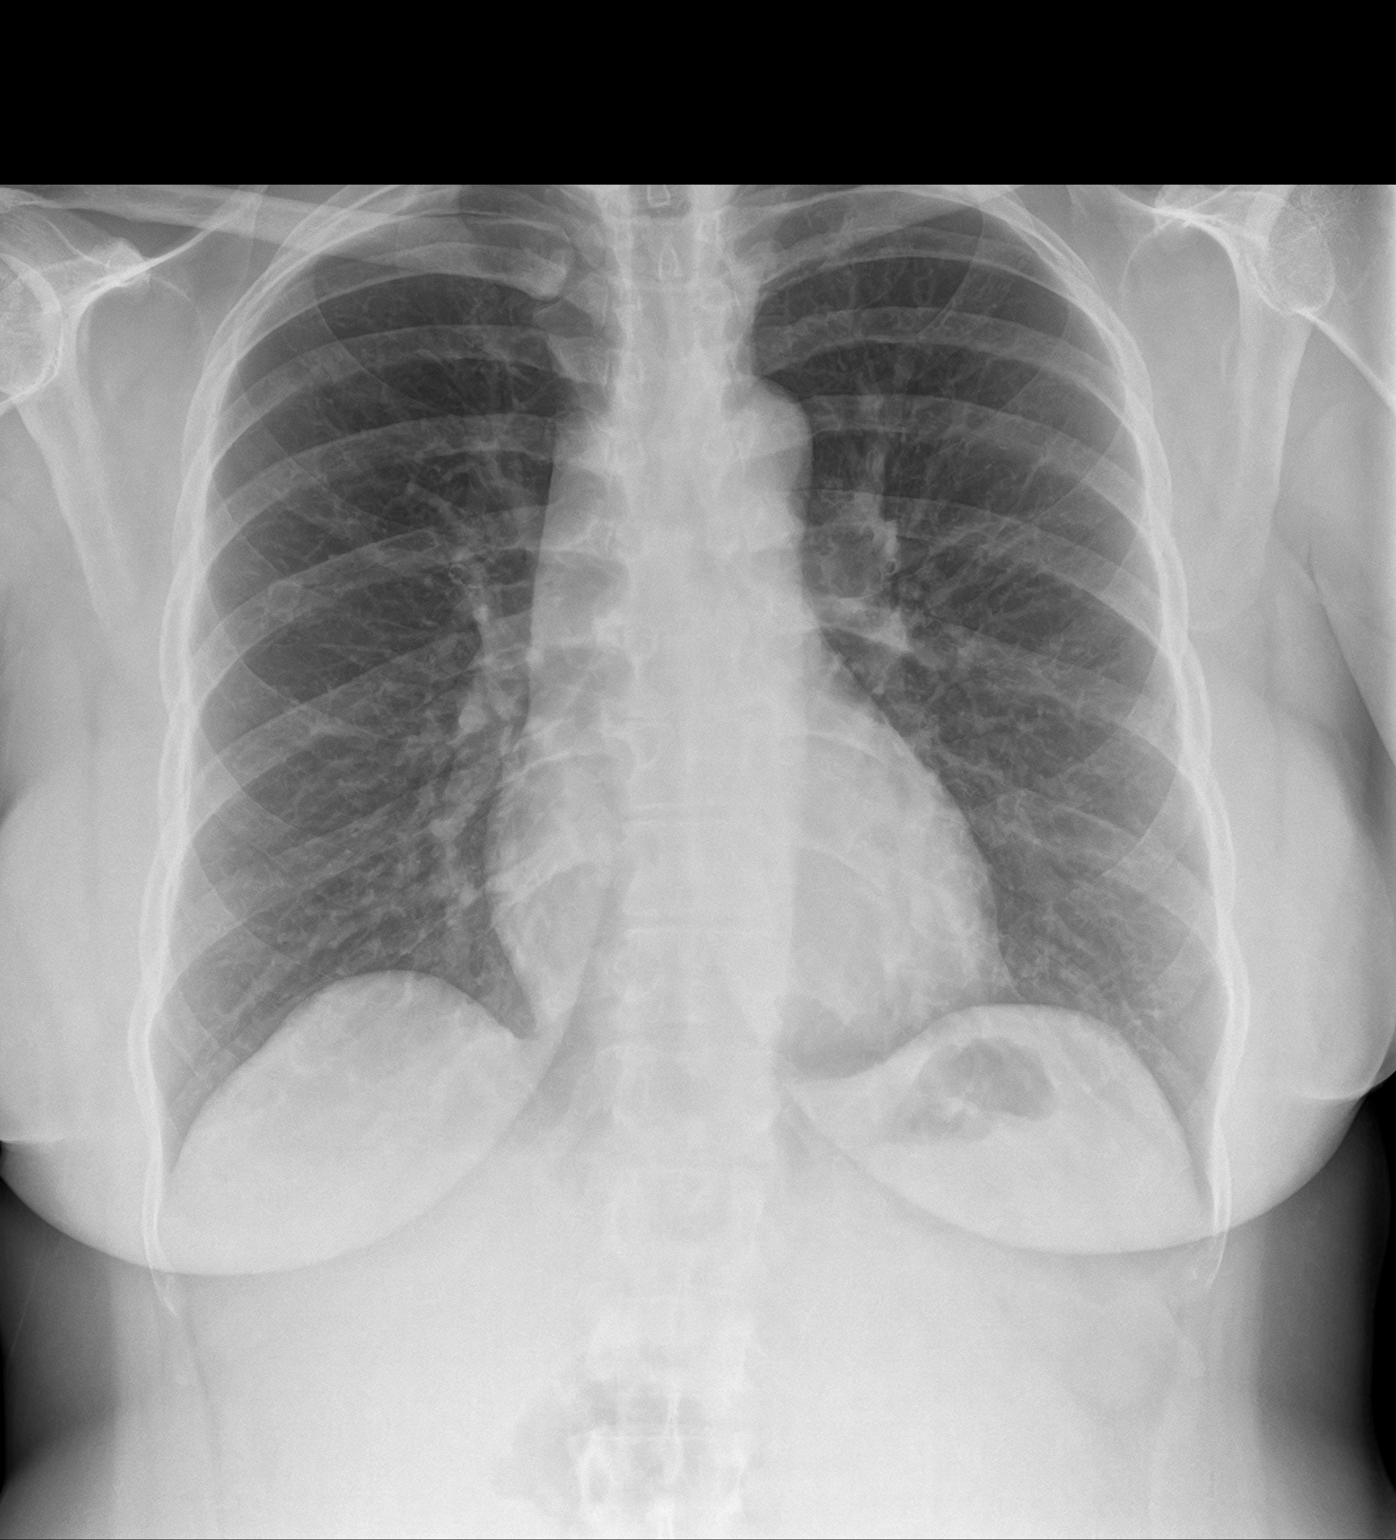
[im 2/2]
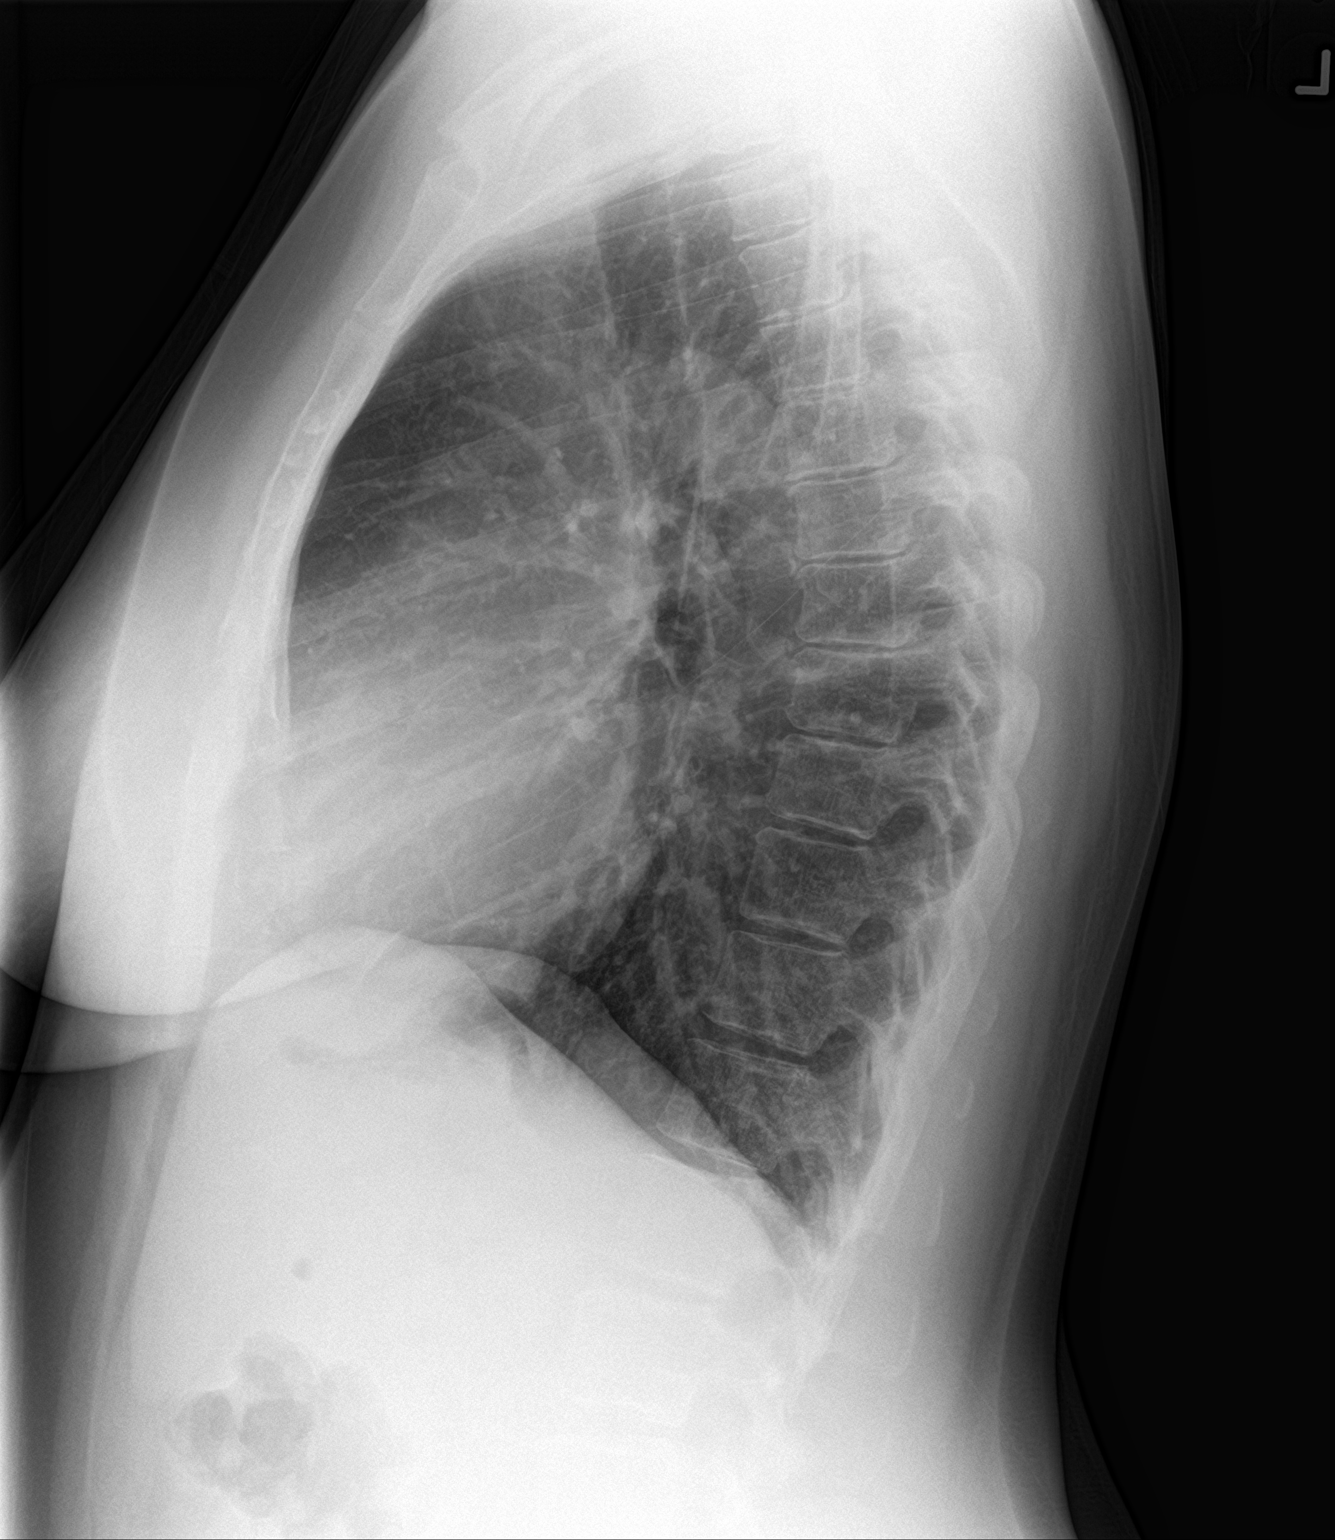

[2 of 2 positions shown; findings below may reference images not displayed]

FINDINGS: The heart size and mediastinal contours are within normal limits.
Both lungs are clear. The visualized skeletal structures are
unremarkable.
IMPRESSION: No active cardiopulmonary disease.

## 2021-08-22 ENCOUNTER — Other Ambulatory Visit: Payer: Self-pay

## 2022-05-05 ENCOUNTER — Emergency Department: Payer: Commercial Managed Care - PPO

## 2022-05-05 ENCOUNTER — Other Ambulatory Visit: Payer: Self-pay

## 2022-05-05 ENCOUNTER — Emergency Department
Admission: EM | Admit: 2022-05-05 | Discharge: 2022-05-05 | Disposition: A | Discharge status: Home / Self Care | Payer: Commercial Managed Care - PPO | Attending: Emergency Medicine | Admitting: Emergency Medicine

## 2022-05-05 DIAGNOSIS — Z20822 Contact with and (suspected) exposure to covid-19: Secondary | ICD-10-CM | POA: Insufficient documentation

## 2022-05-05 DIAGNOSIS — R112 Nausea with vomiting, unspecified: Secondary | ICD-10-CM | POA: Diagnosis not present

## 2022-05-05 DIAGNOSIS — E876 Hypokalemia: Secondary | ICD-10-CM | POA: Insufficient documentation

## 2022-05-05 DIAGNOSIS — R1012 Left upper quadrant pain: Secondary | ICD-10-CM | POA: Insufficient documentation

## 2022-05-05 LAB — COMPREHENSIVE METABOLIC PANEL
ALT: 16 U/L (ref 0–44)
AST: 28 U/L (ref 15–41)
Albumin: 4.5 g/dL (ref 3.5–5.0)
Alkaline Phosphatase: 83 U/L (ref 38–126)
Anion gap: 10 (ref 5–15)
BUN: 10 mg/dL (ref 6–20)
CO2: 26 mmol/L (ref 22–32)
Calcium: 9.3 mg/dL (ref 8.9–10.3)
Chloride: 104 mmol/L (ref 98–111)
Creatinine, Ser: 0.83 mg/dL (ref 0.44–1.00)
GFR, Estimated: >60 mL/min (ref >60)
Glucose, Bld: 110 mg/dL — ABNORMAL HIGH (ref 70–99)
Potassium: 3 mmol/L — ABNORMAL LOW (ref 3.5–5.1)
Sodium: 140 mmol/L (ref 135–145)
Total Bilirubin: 1.2 mg/dL (ref 0.3–1.2)
Total Protein: 7.9 g/dL (ref 6.5–8.1)

## 2022-05-05 LAB — CBC
HCT: 37.4 % (ref 36.0–46.0)
Hemoglobin: 11.6 g/dL — ABNORMAL LOW (ref 12.0–15.0)
MCH: 28.2 pg (ref 26.0–34.0)
MCHC: 31 g/dL (ref 30.0–36.0)
MCV: 90.8 fL (ref 80.0–100.0)
Platelets: 282 10*3/uL (ref 150–400)
RBC: 4.12 MIL/uL (ref 3.87–5.11)
RDW: 15.6 % — ABNORMAL HIGH (ref 11.5–15.5)
WBC: 7.1 10*3/uL (ref 4.0–10.5)
nRBC: 0 % (ref 0.0–0.2)

## 2022-05-05 LAB — URINALYSIS, ROUTINE W REFLEX MICROSCOPIC
Bacteria, UA: NONE SEEN
Bilirubin Urine: NEGATIVE
Glucose, UA: NEGATIVE mg/dL
Hgb urine dipstick: NEGATIVE
Ketones, ur: 80 mg/dL — AB
Leukocytes,Ua: NEGATIVE
Nitrite: NEGATIVE
Protein, ur: 100 mg/dL — AB
Specific Gravity, Urine: 1.027 (ref 1.005–1.030)
pH: 5 (ref 5.0–8.0)

## 2022-05-05 LAB — PREGNANCY, URINE: Preg Test, Ur: NEGATIVE

## 2022-05-05 LAB — RESP PANEL BY RT-PCR (FLU A&B, COVID) ARPGX2
Influenza A by PCR: NEGATIVE
Influenza B by PCR: NEGATIVE
SARS Coronavirus 2 by RT PCR: NEGATIVE

## 2022-05-05 LAB — LIPASE, BLOOD: Lipase: 24 U/L (ref 11–51)

## 2022-05-05 MED ORDER — LACTATED RINGERS IV BOLUS
1000.0000 mL | Freq: Once | INTRAVENOUS | Status: AC
  Administered 2022-05-05: 1000 mL via INTRAVENOUS

## 2022-05-05 MED ORDER — ONDANSETRON HCL 4 MG/2ML IJ SOLN
4.0000 mg | Freq: Once | INTRAMUSCULAR | Status: AC
  Administered 2022-05-05: 4 mg via INTRAVENOUS
  Filled 2022-05-05: qty 2

## 2022-05-05 MED ORDER — IOHEXOL 300 MG/ML  SOLN
100.0000 mL | Freq: Once | INTRAMUSCULAR | Status: AC | PRN
  Administered 2022-05-05: 100 mL via INTRAVENOUS

## 2022-05-05 MED ORDER — ONDANSETRON 4 MG PO TBDP: 4.0000 mg | ORAL_TABLET | Freq: Three times a day (TID) | ORAL | 0 refills | Status: AC | PRN

## 2022-05-05 NOTE — ED Provider Notes (Signed)
[facility] Provider Note    Event Date/Time   First MD Initiated Contact with Patient 05/05/22 1504     (approximate)   History   No chief complaint on file.   HPI  Lori Bradley is a 46 y.o. female presents to the emergency department for treatment and evaluation of nausea and vomiting. No fever, shortness of breath, sore throat, headache, cough.  Past Medical History:  Diagnosis Date   Anemia    Anxiety      Physical Exam   Triage Vital Signs: ED Triage Vitals  Enc Vitals Group     BP 05/05/22 1412 (!) 137/100     Pulse Rate 05/05/22 1412 68     Resp 05/05/22 1412 17     Temp 05/05/22 1412 98.2 F (36.8 C)     Temp Source 05/05/22 1412 Oral     SpO2 05/05/22 1412 98 %     Weight 05/05/22 1414 224 lb (101.6 kg)     Height 05/05/22 1414 5\' 6"  (1.676 m)     Head Circumference --      Peak Flow --      Pain Score 05/05/22 1414 9     Pain Loc --      Pain Edu? --      Excl. in GC? --     Most recent vital signs: Vitals:   05/05/22 1412 05/05/22 1817  BP: (!) 137/100 (!) 154/102  Pulse: 68 74  Resp: 17 18  Temp: 98.2 F (36.8 C) 98.4 F (36.9 C)  SpO2: 98% 97%    General: Awake, no distress.  CV:  Good peripheral perfusion.  Resp:  Normal effort.  Breath sounds clear to auscultation Abd:  No distention.  Tenderness over the left upper quadrant Other:     ED Results / Procedures / Treatments   Labs (all labs ordered are listed, but only abnormal results are displayed) Labs Reviewed  CBC - Abnormal; Notable for the following components:      Result Value   Hemoglobin 11.6 (*)    RDW 15.6 (*)    All other components within normal limits  COMPREHENSIVE METABOLIC PANEL - Abnormal; Notable for the following components:   Potassium 3.0 (*)    Glucose, Bld 110 (*)    All other components within normal limits  URINALYSIS, ROUTINE W REFLEX MICROSCOPIC - Abnormal; Notable for the following components:   Color, Urine  YELLOW (*)    APPearance HAZY (*)    Ketones, ur 80 (*)    Protein, ur 100 (*)    All other components within normal limits  RESP PANEL BY RT-PCR (FLU A&B, COVID) ARPGX2  LIPASE, BLOOD  PREGNANCY, URINE     EKG  Not indicated   RADIOLOGY  CT abdomen and pelvis shows no acute concerns.  I have independently reviewed and interpreted imaging as well as reviewed report from radiology.  PROCEDURES:  Critical Care performed: No  Procedures   MEDICATIONS ORDERED IN ED:  Medications  lactated ringers bolus 1,000 mL (0 mLs Intravenous Stopped 05/05/22 1701)  ondansetron (ZOFRAN) injection 4 mg (4 mg Intravenous Given 05/05/22 1535)  iohexol (OMNIPAQUE) 300 MG/ML solution 100 mL (100 mLs Intravenous Contrast Given 05/05/22 1714)     IMPRESSION / MDM / ASSESSMENT AND PLAN / ED COURSE   I reviewed the triage vital signs and the nursing notes.  Differential diagnosis includes, but is not limited to: Pancreatitis, gastroenteritis, colitis  Patient's presentation is  most consistent with acute presentation with potential threat to life or bodily function.  46 year old female presenting to the emergency department for treatment and evaluation of vomiting that started Sunday.  She states that she now has dry heaves as she has nothing left in her stomach.  Plan will be to await lab results and get a COVID and influenza test.  She will receive IV fluids and Zofran as well.  Labs are overall reassuring.  She is mildly hypokalemic at 3.0.  Urine shows some concern for dehydration with 80 of ketones and 100 protein but is otherwise negative for sign of infection.  Lipase is normal.  Abdominal pain likely secondary to muscle pain from vomiting.  CT abdomen and pelvis is negative for acute concerns.  COVID and influenza test is negative.  Patient feels better after fluids and Zofran.  She was able to tolerate ice chips.  Plan will be to discharge her home with a prescription for Zofran and  encouraged her to start with clear liquid diet and progress to bland foods as tolerated.  She was encouraged to follow-up with her primary care provider if all symptoms have not resolved within the next couple days.  If symptoms change or worsen, she was advised to return to the emergency department.      FINAL CLINICAL IMPRESSION(S) / ED DIAGNOSES   Final diagnoses:  Acute LUQ pain  Nausea and vomiting, unspecified vomiting type     Rx / DC Orders   ED Discharge Orders          Ordered    ondansetron (ZOFRAN-ODT) 4 MG disintegrating tablet  Every 8 hours PRN        08 /01/23 1801             Note:  This document was prepared using Dragon voice recognition software and may include unintentional dictation errors.   12-25-1972, FNP 05/06/22 07/06/22    Ivor Reining, MD 05/06/22 2329

## 2022-05-05 NOTE — ED Notes (Signed)
See triage note  Presents with left sided abd pain  Positive n/v  Afebrile on arrival   Abd is tender on palpation

## 2022-05-05 NOTE — ED Provider Triage Note (Signed)
Emergency Medicine Provider Triage Evaluation Note  Lori Bradley , a 46 y.o. female  was evaluated in triage.  Pt complains of N, V and chills. Left sided abdominal pain.    Review of Systems  Positive: + GERD,  Negative: - UTI or stones in past.  Physical Exam  BP (!) 137/100 (BP Location: Left Arm)   Pulse 68   Temp 98.2 F (36.8 C) (Oral)   Resp 17   Ht 5\' 6"  (1.676 m)   Wt 101.6 kg   SpO2 98%   BMI 36.15 kg/m  Gen:   Awake, no distress   Resp:  Normal effort  MSK:   Moves extremities without difficulty  Other:    Medical Decision Making  Medically screening exam initiated at 2:16 PM.  Appropriate orders placed.  ANNEKE CUNDY was informed that the remainder of the evaluation will be completed by another provider, this initial triage assessment does not replace that evaluation, and the importance of remaining in the ED until their evaluation is complete.     Fatima Blank, PA-C 05/05/22 1419

## 2022-05-05 NOTE — ED Notes (Signed)
Pt gives verbal consent to dc  

## 2022-05-05 NOTE — ED Notes (Signed)
Pt now stating L lower abd pain not L upper.

## 2022-05-05 NOTE — ED Triage Notes (Signed)
Pt in from home due to N/V; states has acid reflux issues; chills; L-sided abdominal pain--is "sore and aches at times" per pt. Tender at L side. Pt's resp reg/unlabored, skin dry, sitting calmly.

## 2022-05-05 NOTE — ED Notes (Signed)
Blood collected by Shanda Bumps NT.
